# Patient Record
Sex: Male | Born: 1975 | Race: Black or African American | Hispanic: No | Marital: Married | Smoking: Current some day smoker
Health system: Southern US, Community
[De-identification: ages and names within clinical notes are randomized; demographics above are authoritative.]

## PROBLEM LIST (undated history)

## (undated) HISTORY — PX: KNEE SURGERY: SHX244

---

## 1999-08-02 ENCOUNTER — Inpatient Hospital Stay (HOSPITAL_COMMUNITY): Admission: EM | Admit: 1999-08-02 | Discharge: 1999-08-10 | Payer: Self-pay | Admitting: Emergency Medicine

## 1999-08-02 ENCOUNTER — Encounter: Payer: Self-pay | Admitting: Emergency Medicine

## 1999-08-04 ENCOUNTER — Encounter: Payer: Self-pay | Admitting: Pulmonary Disease

## 1999-08-05 ENCOUNTER — Encounter: Payer: Self-pay | Admitting: Pulmonary Disease

## 1999-08-06 ENCOUNTER — Encounter: Payer: Self-pay | Admitting: Pulmonary Disease

## 1999-08-07 ENCOUNTER — Encounter: Payer: Self-pay | Admitting: Pulmonary Disease

## 1999-08-10 ENCOUNTER — Encounter: Payer: Self-pay | Admitting: Pulmonary Disease

## 2003-08-14 ENCOUNTER — Emergency Department (HOSPITAL_COMMUNITY): Admission: EM | Admit: 2003-08-14 | Discharge: 2003-08-14 | Payer: Self-pay | Admitting: Emergency Medicine

## 2005-10-05 ENCOUNTER — Ambulatory Visit: Payer: Self-pay | Admitting: Family Medicine

## 2005-10-07 ENCOUNTER — Ambulatory Visit: Payer: Self-pay | Admitting: Family Medicine

## 2005-10-07 ENCOUNTER — Encounter: Admission: RE | Admit: 2005-10-07 | Discharge: 2005-10-07 | Payer: Self-pay | Admitting: Family Medicine

## 2006-04-12 ENCOUNTER — Ambulatory Visit: Payer: Self-pay | Admitting: Family Medicine

## 2006-04-21 ENCOUNTER — Encounter: Admission: RE | Admit: 2006-04-21 | Discharge: 2006-04-21 | Payer: Self-pay | Admitting: Family Medicine

## 2006-05-12 ENCOUNTER — Ambulatory Visit: Payer: Self-pay | Admitting: Family Medicine

## 2007-02-20 ENCOUNTER — Emergency Department (HOSPITAL_COMMUNITY): Admission: EM | Admit: 2007-02-20 | Discharge: 2007-02-20 | Payer: Self-pay | Admitting: Emergency Medicine

## 2007-02-28 ENCOUNTER — Ambulatory Visit: Payer: Self-pay | Admitting: Family Medicine

## 2007-06-13 ENCOUNTER — Ambulatory Visit: Payer: Self-pay | Admitting: Family Medicine

## 2007-07-25 ENCOUNTER — Ambulatory Visit: Payer: Self-pay | Admitting: Family Medicine

## 2007-09-20 ENCOUNTER — Ambulatory Visit: Payer: Self-pay | Admitting: Family Medicine

## 2007-10-16 ENCOUNTER — Ambulatory Visit: Payer: Self-pay | Admitting: Family Medicine

## 2007-11-30 ENCOUNTER — Encounter: Admission: RE | Admit: 2007-11-30 | Discharge: 2007-11-30 | Payer: Self-pay | Admitting: Family Medicine

## 2007-12-22 ENCOUNTER — Ambulatory Visit: Payer: Self-pay | Admitting: Family Medicine

## 2008-01-03 ENCOUNTER — Emergency Department (HOSPITAL_COMMUNITY): Admission: EM | Admit: 2008-01-03 | Discharge: 2008-01-03 | Payer: Self-pay | Admitting: Emergency Medicine

## 2009-09-20 IMAGING — CT CT HEAD W/O CM
1 series · 16 of 30 positions shown, 20 images · IV contrast (agent unspecified)
Comparison: none

CLINICAL DATA: Severe headache.
 HEAD CT WITHOUT CONTRAST:
TECHNIQUE: Contiguous axial CT images were obtained from the base of the skull through the vertex according to standard protocol without contrast.   
 No comparison.

[Series 2: head routine 4.8 h47s · axial · 0.46mm/px · z∈[-110,+21]mm · 16 of 30 slices shown, 20 images]
[im 2/30  brain]
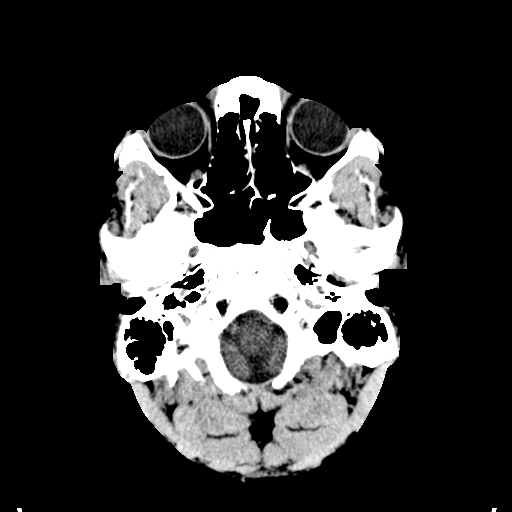
[im 2/30  bone]
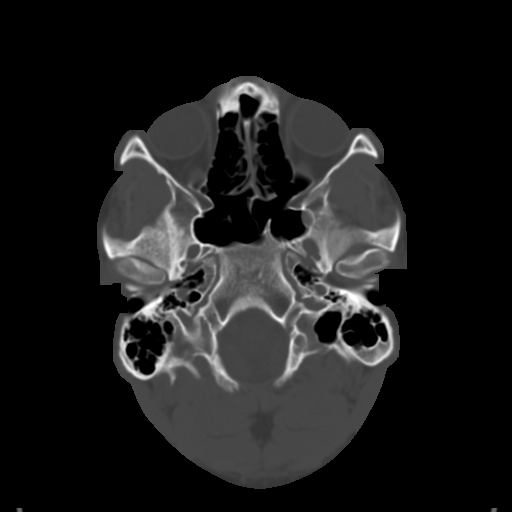
[im 4/30  brain]
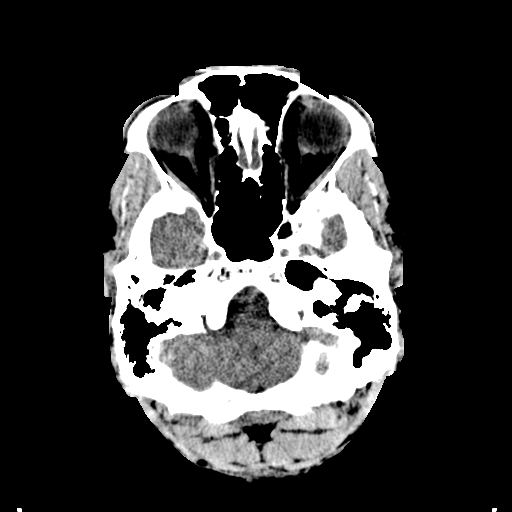
[im 6/30  brain]
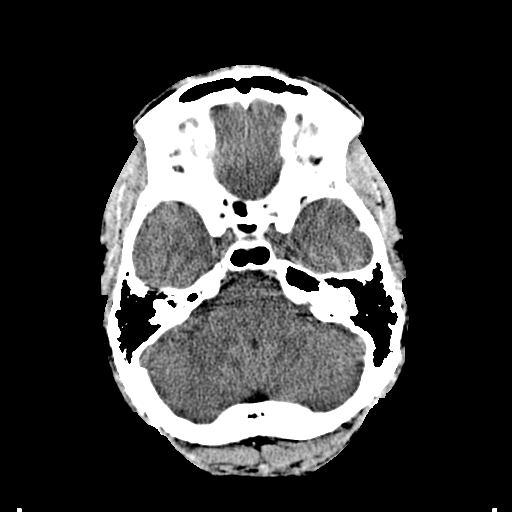
[im 8/30  brain]
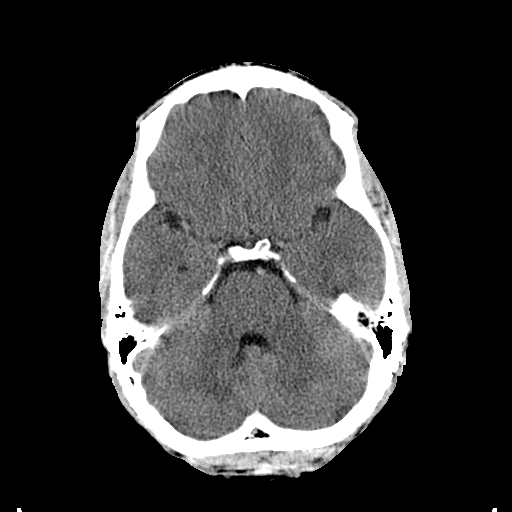
[im 9/30  brain]
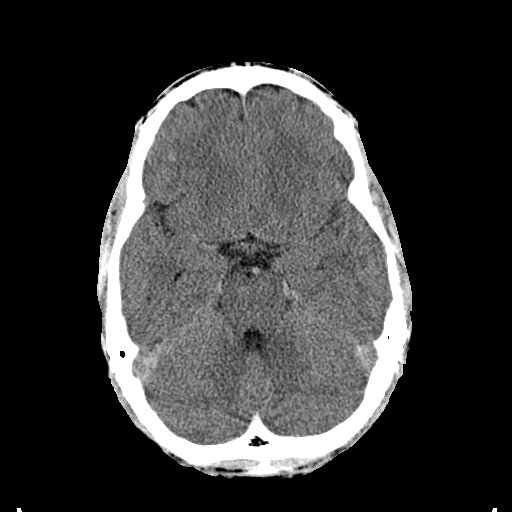
[im 9/30  bone]
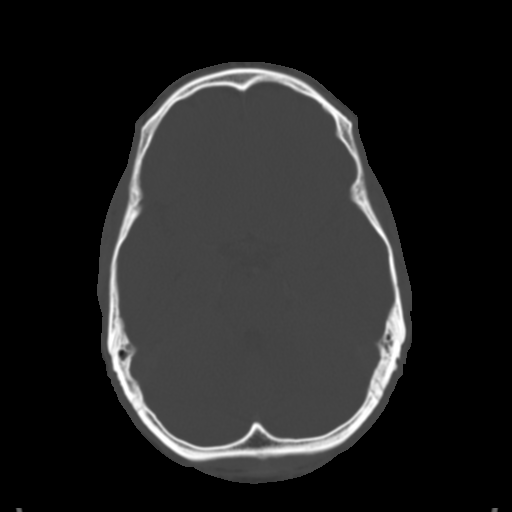
[im 11/30  brain]
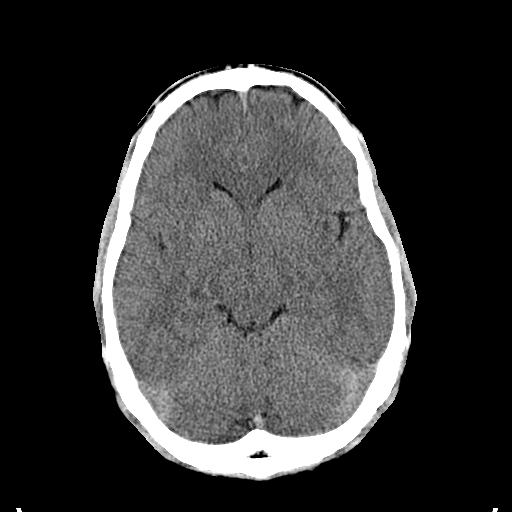
[im 13/30  brain]
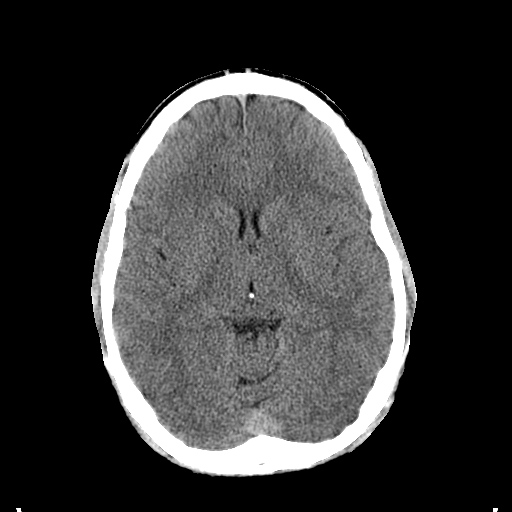
[im 15/30  brain]
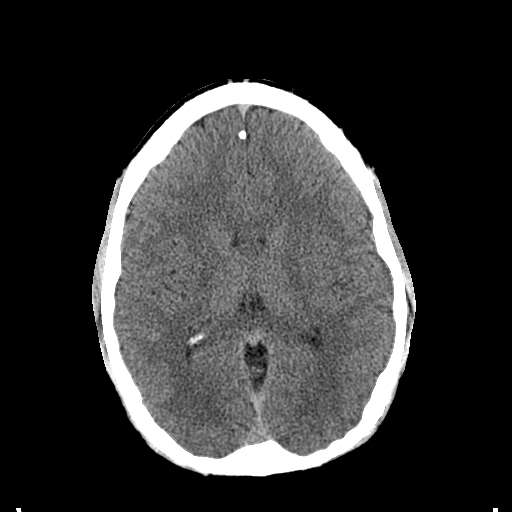
[im 16/30  brain]
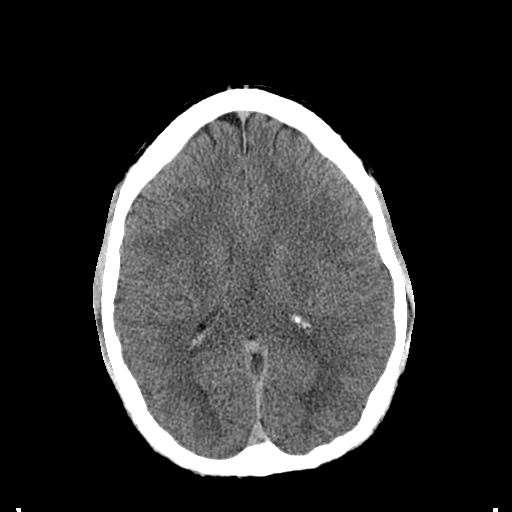
[im 16/30  bone]
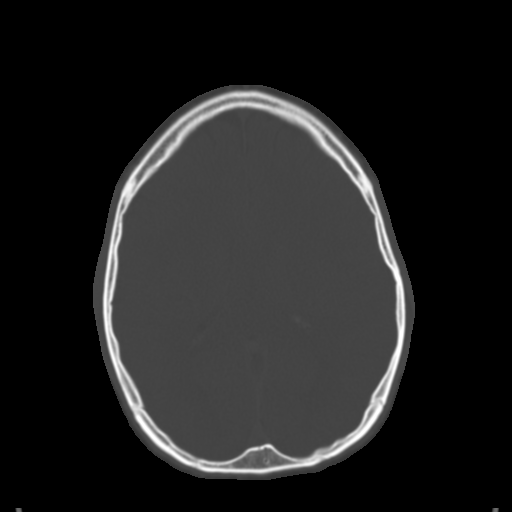
[im 18/30  brain]
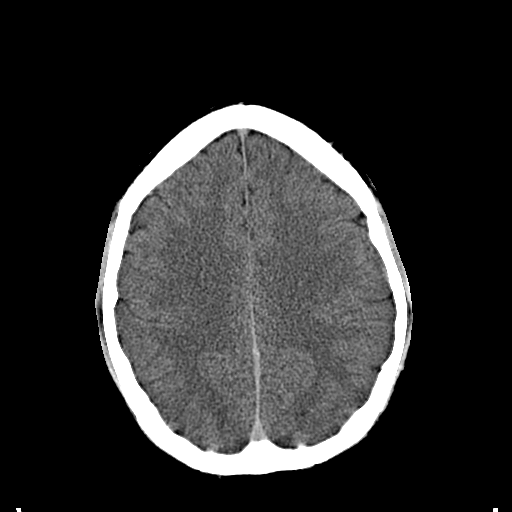
[im 20/30  brain]
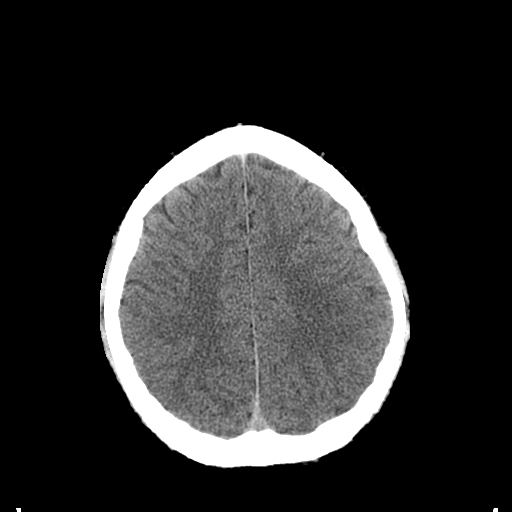
[im 22/30  brain]
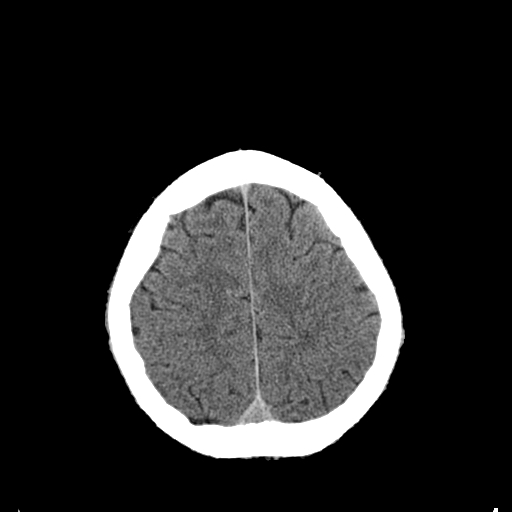
[im 23/30  brain]
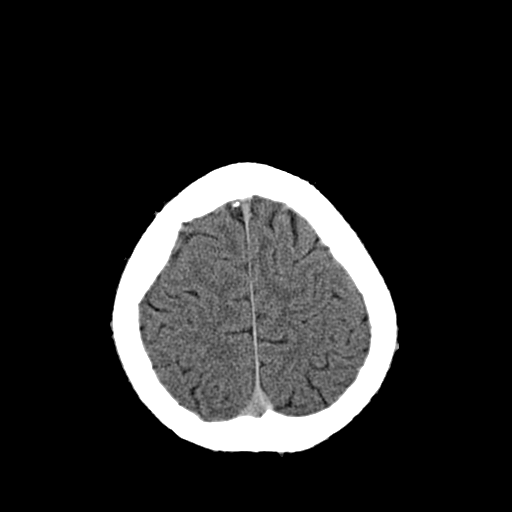
[im 23/30  bone]
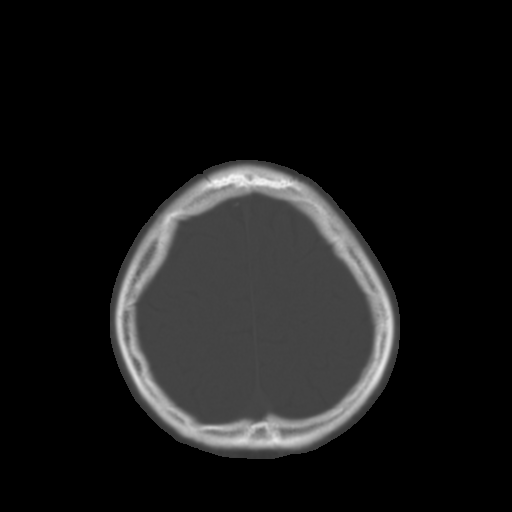
[im 25/30  brain]
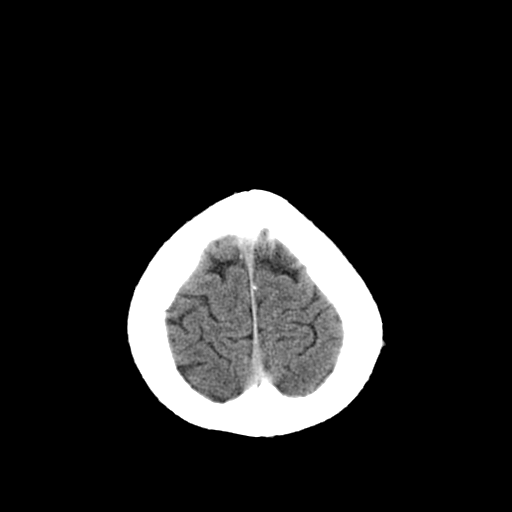
[im 27/30  brain]
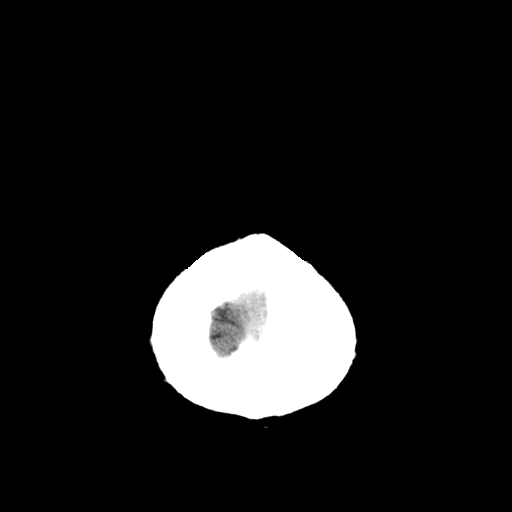
[im 29/30  brain]
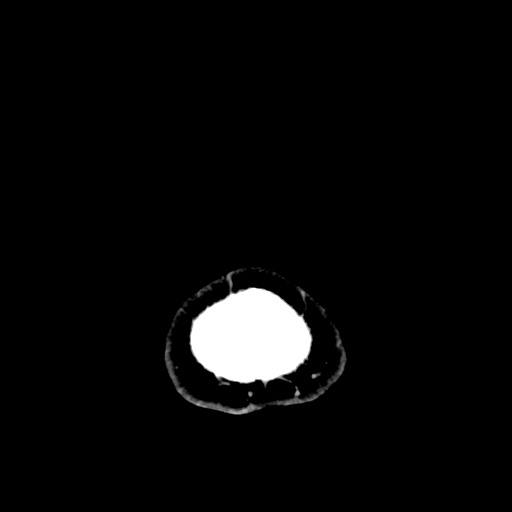

[16 of 30 positions shown; findings below may reference images not displayed]

FINDINGS: The brain has a normal appearance without evidence of atrophy, stroke, mass, hemorrhage, hydrocephalus, or extra-axial collection.  The visualized sinuses are clear.  The middle ears and mastoids are clear.
IMPRESSION: Normal examination.

## 2010-07-31 NOTE — Discharge Summary (Signed)
[facility]  Patient:    Timothy White, Timothy White                         MRN: [id number] Adm. Date:  [id number] Disc. Date: [id number] Attending:  Silvio Pate Dictator:   Earley Favor, RN, MSN, ACNP                           Discharge Summary  DATE OF BIRTH:  January 12, 1975  DISCHARGE DIAGNOSES: 1. Acute respiratory failure, acute respiratory distress syndrome, pneumonia. 2. Septic shock. 3. Acute renal failure. 4. History of asthma.  HISTORY OF PRESENT ILLNESS:  Timothy White is a 35 year old black male, who was admitted to [facility] on Aug 02, 1999, with respiratory distress, hypoxemia, and septic shock.  He had a four-week history of cough that was productive of green mucus, but has been able to get by and work.  On Aug 01, 1999, he had severe fever, chills, sweat, and cough, with hemoptysis and increasing shortness of breath with pleuritic left chest pain.  He presented to the [facility] Emergency Department and had a blood pressure in the 70s, with a temperature of 101 degrees.  He was found to have a dense left lower lobe infiltrate with questionable effusion.  He also has required supplemental oxygen i.e. at 4 L nasal cannula to keep his saturations at greater than 90%.  Despite 4 L of intravenous fluids, he remained hypotensive.  He was admitted to [facility] at that time for further evaluation of acute respiratory failure, septic shock, and acute renal failure.  LABORATORY DATA:  Arterial blood gases on room air on Aug 02, 1999:  pH of 7.45, PCO2 of 35, PO2 of 54.  On 100% nonrebreather:  pH of 7.30, PCO2 of 41, PO2 of 09. On 75% FIO2 BiPAP with a rate of 10, with expiratory 5 and inspiratory of 12: H of 7.38, PCO2 of 36, PO2 of 115.  The wbcs 10.3, rbcs 467, hemoglobin 14.0, hematocrit 42.2, MCV 90.4, MCHC 33.2, RDW 13.2, platelets 344, neutrophils 71, absolute  neutrophils 7.2, lymphs 13, absolute lymphs 1.4, monocytes 5, eosinophils 8, absolute eosinophils 0.8.  ESR 11.  INR 2.6, PTT 47.  Sodium 139, potassium .2, chloride 113, CO2 of 22, BUN 22, creatinine 1.5, glucose 94.  Calcium 7.6, total protein 4.8, ALB 2.5, AST 27, LDH 151.  HIV is nonreactive.  Blood cultures demonstrate no growth after five days.  Sputum culture is negative, shows normal orolaryngeal flora.  a 12-lead electrocardiogram shows sinus tachycardia but otherwise a normal electrocardiogram.  A chest x-ray demonstrates left lower atelectasis and infiltrate, generalized accentuation of hilar and basilar markings, prominent left ventricle.  A CT of he chest demonstrated a left lower lobe infiltrate with incomplete consolidation, small pleural effusion, and probable significant obstruction of the bronchus of the left lower lobe.  There are also scattered inflammatory changes at the right base, the left lingula, and right middle lobe areas.  HOSPITAL COURSE: #1 - ACUTE RESPIRATORY FAILURE WITH ACUTE RESPIRATORY DISTRESS SYNDROME, SECONDARY TO PNEUMONIA:  Timothy White was admitted to [facility] on Aug 02, 1999, in septic shock with radiographic changes consistent with left lower lobe  consolidation with pleural effusion and most likely secondary pneumonia. Sputum cultures did not demonstrate any organ specified.  He was treated empirically with Rocephin and Zithromax.  He responded to antimicrobial therapy, and managed to avoid orotracheal intubation.  Nonmechanical ventilatory support, i.e. BiPAP was utilized for a brief period of time to decrease his work of breathing. Arterial blood gases as mentioned in the laboratory data demonstrate improved aeration by the day of discharge.  He reached maximal hospital benefit by Aug 10, 1999, and was discharged home.  #2 - SEPTIC SHOCK:  The septic shock was treated with IV fluids and  dopamine. e responded to fluid resuscitation and vasopressor drip.  The vasopressor drip and fluid resuscitation were effective, and were discontinued after approximately 24 hours.  #3 - ACUTE RENAL FAILURE:  Timothy White BUN and creatinine reached a peak of 31 nd 3.3 respectively on Aug 02, 1999, responded to fluid resuscitation with a BUN of 12, creatinine 1.1 prior to discharge.  #4 - HISTORY OF ASTHMA:  Timothy White has a known history of asthma.  He also has  been smoking.  He was given special instructions on smoking cessation.  He will be discharged on Adavir 550 one puff b.i.d.  DIET:  Regular diet.  SPECIAL INSTRUCTIONS:  No smoking.  FOLLOWUP:  He has an appointment to follow up with Dr. Onalee Hua B. Simonds in one week.  CONDITION ON DISCHARGE:  Acute respiratory failure has been resolved.  Acute septic shock syndrome has been resolved.  He is discharged home in an improved condition. DD:  08/20/99 TD:  08/26/99 Job: [id number]

## 2010-09-18 ENCOUNTER — Emergency Department (HOSPITAL_COMMUNITY)
Admission: EM | Admit: 2010-09-18 | Discharge: 2010-09-18 | Disposition: A | Discharge status: Home / Self Care | Payer: Worker's Compensation | Attending: Emergency Medicine | Admitting: Emergency Medicine

## 2010-09-18 DIAGNOSIS — Y9269 Other specified industrial and construction area as the place of occurrence of the external cause: Secondary | ICD-10-CM | POA: Insufficient documentation

## 2010-09-18 DIAGNOSIS — X58XXXA Exposure to other specified factors, initial encounter: Secondary | ICD-10-CM | POA: Insufficient documentation

## 2010-09-18 DIAGNOSIS — R0602 Shortness of breath: Secondary | ICD-10-CM | POA: Insufficient documentation

## 2010-09-18 DIAGNOSIS — T7840XA Allergy, unspecified, initial encounter: Secondary | ICD-10-CM | POA: Insufficient documentation

## 2010-09-18 DIAGNOSIS — Y990 Civilian activity done for income or pay: Secondary | ICD-10-CM | POA: Insufficient documentation

## 2011-08-14 ENCOUNTER — Encounter (HOSPITAL_BASED_OUTPATIENT_CLINIC_OR_DEPARTMENT_OTHER): Payer: Self-pay | Admitting: *Deleted

## 2011-08-14 ENCOUNTER — Emergency Department (HOSPITAL_BASED_OUTPATIENT_CLINIC_OR_DEPARTMENT_OTHER)
Admission: EM | Admit: 2011-08-14 | Discharge: 2011-08-14 | Disposition: A | Discharge status: Home / Self Care | Payer: Self-pay | Attending: Emergency Medicine | Admitting: Emergency Medicine

## 2011-08-14 DIAGNOSIS — H6122 Impacted cerumen, left ear: Secondary | ICD-10-CM

## 2011-08-14 DIAGNOSIS — H6120 Impacted cerumen, unspecified ear: Secondary | ICD-10-CM | POA: Insufficient documentation

## 2011-08-14 DIAGNOSIS — F17200 Nicotine dependence, unspecified, uncomplicated: Secondary | ICD-10-CM | POA: Insufficient documentation

## 2011-08-14 DIAGNOSIS — J45909 Unspecified asthma, uncomplicated: Secondary | ICD-10-CM | POA: Insufficient documentation

## 2011-08-14 NOTE — Discharge Instructions (Signed)
Never put any object in your ear that is any smaller than you're elbow. Take Tylenol or ibuprofen or Aleve as needed for pain. You can use over-the-counter ear wax products on an as-needed basis.  Cerumen Impaction A cerumen impaction is when the wax in your ear forms a plug. This plug usually causes reduced hearing. Sometimes it also causes an earache or dizziness. Removing a cerumen impaction can be difficult and painful. The wax sticks to the ear canal. The canal is sensitive and bleeds easily. If you try to remove a heavy wax buildup with a cotton tipped swab, you may push it in further. Irrigation with water, suction, and small ear curettes may be used to clear out the wax. If the impaction is fixed to the skin in the ear canal, ear drops may be needed for a few days to loosen the wax. People who build up a lot of wax frequently can use ear wax removal products available in your local drugstore. SEEK MEDICAL CARE IF:  You develop an earache, increased hearing loss, or marked dizziness. Document Released: 04/08/2004 Document Revised: 02/18/2011 Document Reviewed: 05/29/2009 [facility] Patient Information 2012 [place], [place].

## 2011-08-14 NOTE — ED Provider Notes (Signed)
History   This chart was scribed for Timothy Booze, MD by Brooks Sailors. The patient was seen in room MH11/MH11. Patient's care was started at 1632.  CSN: [id number]  Arrival date & time 08/14/11  1632   First MD Initiated Contact with Patient 08/14/11 1657      Chief Complaint  Patient presents with  . Otalgia  . Knee Pain    (Consider location/radiation/quality/duration/timing/severity/associated sxs/prior treatment) Patient is a 36 y.o. male presenting with ear pain and knee pain. The history is provided by the patient. No language interpreter was used.  Otalgia This is a new problem. The current episode started 6 to 12 hours ago. There is pain in the left ear. The problem occurs constantly. The problem has not changed since onset.There has been no fever. The pain is at a severity of 8/10. The pain is moderate. Associated symptoms include hearing loss.  Knee Pain    Timothy White is a 36 y.o. male who presents to the Emergency Department complaining of ear pain onset this morning 9 hours ago. Pt says his left ear was itching so he tried to scratch it with finger without relief. Then patient used Q-tip to help ear. Now pt reports he can not hear, and was able to hear before this incident. Pt says his pain is currently a 8 at the time. Pt notes he normally is in good health and sometimes smokes a cigar (1/week).   Past Medical History  Diagnosis Date  . Asthma     Past Surgical History  Procedure Date  . Knee surgery     No family history on file.  History  Substance Use Topics  . Smoking status: Current Some Day Smoker    Types: Cigars  . Smokeless tobacco: Never Used  . Alcohol Use: 6.0 oz/week    5 Cans of beer, 5 Shots of liquor per week      Review of Systems  HENT: Positive for hearing loss and ear pain.   All other systems reviewed and are negative.    Allergies  Shellfish allergy  Home Medications   Current Outpatient Rx  Name Route Sig Dispense  Refill  . ALBUTEROL SULFATE HFA 108 (90 BASE) MCG/ACT IN AERS Inhalation Inhale 2 puffs into the lungs every 6 (six) hours as needed. For shortness of breath    . IBUPROFEN 800 MG PO TABS Oral Take 2,400 mg by mouth daily as needed. For pain    . MENTHOL (TOPICAL ANALGESIC) 5 % EX PADS Apply externally Apply 1 patch topically daily as needed. For knee pain      BP 130/74  Pulse 76  Temp(Src) 98.7 F (37.1 C) (Oral)  Resp 18  Ht 6\' 4"  (1.93 m)  Wt 240 lb (108.863 kg)  BMI 29.21 kg/m2  SpO2 100%  Physical Exam  Nursing note and vitals reviewed. Constitutional: He is oriented to person, place, and time. He appears well-developed and well-nourished.  HENT:  Head: Normocephalic and atraumatic.       Right ear moderate cerumen, Left ear has cerumen impaction.   Eyes: Conjunctivae and EOM are normal. Pupils are equal, round, and reactive to light.  Neck: Normal range of motion. Neck supple.  Cardiovascular: Normal rate and regular rhythm.   Pulmonary/Chest: Effort normal and breath sounds normal.  Abdominal: Soft. Bowel sounds are normal.  Musculoskeletal: Normal range of motion.  Neurological: He is alert and oriented to person, place, and time.  Skin: Skin is warm and dry.  Psychiatric: He has a normal mood and affect.    ED Course  Procedures (including critical care time) DIAGNOSTIC STUDIES: Oxygen Saturation is 100% on room air, normal by my interpretation.    COORDINATION OF CARE: 1700 Patient informed of current plan for treatment and evaluation and agrees with plan at this time.      1. Cerumen impaction, left       MDM  Cerumen impaction of the left ear.  Of ear was irrigated by our in and on reexam, there is mild erythema of the tympanic membrane but no perforation. There is successful removal of the cerumen impaction.      I personally performed the services described in this documentation, which was scribed in my presence. The recorded information has  been reviewed and considered.      Timothy Booze, MD 08/14/11 1745

## 2011-08-14 NOTE — ED Notes (Signed)
Pt c/o left pain x 2-3 days- denies injury- also c/o left ear pain x 1 day

## 2012-02-26 ENCOUNTER — Emergency Department (HOSPITAL_COMMUNITY)
Admission: EM | Admit: 2012-02-26 | Discharge: 2012-02-26 | Disposition: A | Discharge status: Home / Self Care | Payer: Self-pay | Attending: Emergency Medicine | Admitting: Emergency Medicine

## 2012-02-26 ENCOUNTER — Encounter (HOSPITAL_COMMUNITY): Payer: Self-pay | Admitting: *Deleted

## 2012-02-26 DIAGNOSIS — R05 Cough: Secondary | ICD-10-CM | POA: Insufficient documentation

## 2012-02-26 DIAGNOSIS — F17200 Nicotine dependence, unspecified, uncomplicated: Secondary | ICD-10-CM | POA: Insufficient documentation

## 2012-02-26 DIAGNOSIS — J45901 Unspecified asthma with (acute) exacerbation: Secondary | ICD-10-CM | POA: Insufficient documentation

## 2012-02-26 DIAGNOSIS — J45909 Unspecified asthma, uncomplicated: Secondary | ICD-10-CM

## 2012-02-26 DIAGNOSIS — R059 Cough, unspecified: Secondary | ICD-10-CM | POA: Insufficient documentation

## 2012-02-26 MED ORDER — AEROCHAMBER Z-STAT PLUS/MEDIUM MISC: 1.0000 | Freq: Once | Status: DC

## 2012-02-26 MED ORDER — ALBUTEROL SULFATE HFA 108 (90 BASE) MCG/ACT IN AERS
2.0000 | INHALATION_SPRAY | RESPIRATORY_TRACT | Status: DC | PRN
  Administered 2012-02-26: 2 via RESPIRATORY_TRACT
  Filled 2012-02-26: qty 6.7

## 2012-02-26 MED ORDER — IPRATROPIUM BROMIDE 0.02 % IN SOLN
0.5000 mg | Freq: Once | RESPIRATORY_TRACT | Status: AC
  Administered 2012-02-26: 0.5 mg via RESPIRATORY_TRACT
  Filled 2012-02-26: qty 2.5

## 2012-02-26 MED ORDER — ALBUTEROL SULFATE (5 MG/ML) 0.5% IN NEBU
5.0000 mg | INHALATION_SOLUTION | Freq: Once | RESPIRATORY_TRACT | Status: AC
  Administered 2012-02-26: 5 mg via RESPIRATORY_TRACT
  Filled 2012-02-26: qty 1

## 2012-02-26 MED ORDER — AEROCHAMBER PLUS W/MASK MISC
Status: AC
  Administered 2012-02-26: 11:00:00
  Filled 2012-02-26: qty 1

## 2012-02-26 NOTE — ED Provider Notes (Signed)
History     CSN: [id number]  Arrival date & time 02/26/12  7829   First MD Initiated Contact with Patient 02/26/12 1008      Chief Complaint  Patient presents with  . Asthma    (Consider location/radiation/quality/duration/timing/severity/associated sxs/prior treatment) HPI Comments: Patient with history of asthma, remote hospitalization, no intubation presents with onset of shortness of breath when he awoke this morning. Patient states last night he was fine. He does not have an inhaler. He states that normally he calms himself down and his symptoms improve. He continues to wheeze and came in for evaluation. He denies fever, upper respiratory tract infection symptoms. He has had some chest tightness. No nausea, vomiting, diarrhea or abdominal pain. Onset acute. Course is persistent. Nothing makes symptoms better or worse.  Patient is a 36 y.o. male presenting with asthma. The history is provided by the patient.  Asthma Associated symptoms include coughing. Pertinent negatives include no abdominal pain, chest pain, fever, headaches, myalgias, nausea, rash, sore throat or vomiting.    Past Medical History  Diagnosis Date  . Asthma     Past Surgical History  Procedure Date  . Knee surgery     History reviewed. No pertinent family history.  History  Substance Use Topics  . Smoking status: Current Some Day Smoker    Types: Cigars  . Smokeless tobacco: Never Used  . Alcohol Use: 6.0 oz/week    5 Cans of beer, 5 Shots of liquor per week      Review of Systems  Constitutional: Negative for fever.  HENT: Negative for sore throat and rhinorrhea.   Eyes: Negative for redness.  Respiratory: Positive for cough, chest tightness, shortness of breath and wheezing.   Cardiovascular: Negative for chest pain and leg swelling.  Gastrointestinal: Negative for nausea, vomiting, abdominal pain and diarrhea.  Genitourinary: Negative for dysuria.  Musculoskeletal: Negative for myalgias.   Skin: Negative for rash.  Neurological: Negative for headaches.    Allergies  Shellfish allergy  Home Medications   Current Outpatient Rx  Name  Route  Sig  Dispense  Refill  . ALBUTEROL SULFATE HFA 108 (90 BASE) MCG/ACT IN AERS   Inhalation   Inhale 2 puffs into the lungs every 6 (six) hours as needed. For shortness of breath           BP 122/80  Pulse 107  Temp 99.2 F (37.3 C) (Oral)  Resp 18  SpO2 97%  Physical Exam  Nursing note and vitals reviewed. Constitutional: He appears well-developed and well-nourished.  HENT:  Head: Normocephalic and atraumatic.  Right Ear: Tympanic membrane, external ear and ear canal normal.  Left Ear: Tympanic membrane, external ear and ear canal normal.  Nose: Nose normal. No mucosal edema or rhinorrhea.  Mouth/Throat: Uvula is midline, oropharynx is clear and moist and mucous membranes are normal.  Eyes: Conjunctivae normal are normal. Right eye exhibits no discharge. Left eye exhibits no discharge.  Neck: Normal range of motion. Neck supple.  Cardiovascular: Regular rhythm and normal heart sounds.  Tachycardia present.   Pulmonary/Chest: Effort normal. No respiratory distress. He has wheezes. He has no rales.       Diffuse, moderate, expiratory wheezing bilaterally. Good air movement.  Abdominal: Soft. There is no tenderness.  Neurological: He is alert.  Skin: Skin is warm and dry.  Psychiatric: He has a normal mood and affect.    ED Course  Procedures (including critical care time)  Labs Reviewed - No data to display No  results found.   1. Asthma     10:23 AM Patient seen and examined. Medications ordered.   Vital signs reviewed and are as follows: Filed Vitals:   02/26/12 0946  BP: 122/80  Pulse: 107  Temp: 99.2 F (37.3 C)  Resp: 18   11:10 AM Wheezing resolved on re-exam, mild tachycardia. Patient states he feels jittery after the albuterol but is breathing much better. Will discharge to home with albuterol  inhaler.  Patient urged to return with worsening trouble breathing, increased work of breathing, fever, or if he has any other concerns. He verbalizes understanding and agrees with this plan.   MDM  Patient with asthma attack. No upper respiratory infectious symptoms. He improved greatly with one treatment in emergency department and is ready for discharge. I do not feel that prednisone is indicated at this point given the short duration of symptoms and improvement. Also no exacerbating factors identified. Patient appears well. No concern for pneumonia or PE.        Renne Crigler, [place] 02/26/12 [phone]

## 2012-02-26 NOTE — ED Notes (Signed)
Pt reports having asthma attack this am, doesn't have any inhalers. Airway intact

## 2012-02-26 NOTE — ED Provider Notes (Signed)
Medical screening examination/treatment/procedure(s) were performed by non-physician practitioner and as supervising physician I was immediately available for consultation/collaboration.  Flint Melter, MD 02/26/12 1710

## 2016-01-26 ENCOUNTER — Emergency Department (HOSPITAL_BASED_OUTPATIENT_CLINIC_OR_DEPARTMENT_OTHER): Payer: BLUE CROSS/BLUE SHIELD

## 2016-01-26 ENCOUNTER — Encounter (HOSPITAL_BASED_OUTPATIENT_CLINIC_OR_DEPARTMENT_OTHER): Payer: Self-pay | Admitting: Emergency Medicine

## 2016-01-26 ENCOUNTER — Emergency Department (HOSPITAL_BASED_OUTPATIENT_CLINIC_OR_DEPARTMENT_OTHER)
Admission: EM | Admit: 2016-01-26 | Discharge: 2016-01-26 | Disposition: A | Discharge status: Home / Self Care | Payer: BLUE CROSS/BLUE SHIELD | Attending: Emergency Medicine | Admitting: Emergency Medicine

## 2016-01-26 DIAGNOSIS — F17290 Nicotine dependence, other tobacco product, uncomplicated: Secondary | ICD-10-CM | POA: Insufficient documentation

## 2016-01-26 DIAGNOSIS — R1010 Upper abdominal pain, unspecified: Secondary | ICD-10-CM | POA: Diagnosis present

## 2016-01-26 DIAGNOSIS — R112 Nausea with vomiting, unspecified: Secondary | ICD-10-CM | POA: Diagnosis not present

## 2016-01-26 DIAGNOSIS — J45909 Unspecified asthma, uncomplicated: Secondary | ICD-10-CM | POA: Diagnosis not present

## 2016-01-26 DIAGNOSIS — R197 Diarrhea, unspecified: Secondary | ICD-10-CM | POA: Diagnosis not present

## 2016-01-26 LAB — CBC WITH DIFFERENTIAL/PLATELET
Basophils Absolute: 0 10*3/uL (ref 0.0–0.1)
Basophils Relative: 0 %
Eosinophils Absolute: 0.2 10*3/uL (ref 0.0–0.7)
Eosinophils Relative: 4 %
HEMATOCRIT: 45.4 % (ref 39.0–52.0)
HEMOGLOBIN: 15.4 g/dL (ref 13.0–17.0)
LYMPHS ABS: 2.2 10*3/uL (ref 0.7–4.0)
LYMPHS PCT: 35 %
MCH: 30.4 pg (ref 26.0–34.0)
MCHC: 33.9 g/dL (ref 30.0–36.0)
MCV: 89.5 fL (ref 78.0–100.0)
MONOS PCT: 8 %
Monocytes Absolute: 0.5 10*3/uL (ref 0.1–1.0)
NEUTROS ABS: 3.3 10*3/uL (ref 1.7–7.7)
NEUTROS PCT: 53 %
Platelets: 247 10*3/uL (ref 150–400)
RBC: 5.07 MIL/uL (ref 4.22–5.81)
RDW: 13.3 % (ref 11.5–15.5)
WBC: 6.3 10*3/uL (ref 4.0–10.5)

## 2016-01-26 LAB — COMPREHENSIVE METABOLIC PANEL
ALT: 31 U/L (ref 17–63)
ANION GAP: 6 (ref 5–15)
AST: 28 U/L (ref 15–41)
Albumin: 4 g/dL (ref 3.5–5.0)
Alkaline Phosphatase: 58 U/L (ref 38–126)
BUN: 12 mg/dL (ref 6–20)
CHLORIDE: 104 mmol/L (ref 101–111)
CO2: 27 mmol/L (ref 22–32)
CREATININE: 1.22 mg/dL (ref 0.61–1.24)
Calcium: 9 mg/dL (ref 8.9–10.3)
GFR calc non Af Amer: >60 mL/min (ref >60)
Glucose, Bld: 97 mg/dL (ref 65–99)
POTASSIUM: 4.1 mmol/L (ref 3.5–5.1)
SODIUM: 137 mmol/L (ref 135–145)
Total Bilirubin: 0.9 mg/dL (ref 0.3–1.2)
Total Protein: 7.1 g/dL (ref 6.5–8.1)

## 2016-01-26 LAB — LIPASE, BLOOD: Lipase: 18 U/L (ref 11–51)

## 2016-01-26 MED ORDER — DICYCLOMINE HCL 20 MG PO TABS: 20.0000 mg | ORAL_TABLET | Freq: Two times a day (BID) | ORAL | 0 refills | Status: AC

## 2016-01-26 MED ORDER — SODIUM CHLORIDE 0.9 % IV BOLUS (SEPSIS)
1000.0000 mL | Freq: Once | INTRAVENOUS | Status: AC
  Administered 2016-01-26: 1000 mL via INTRAVENOUS

## 2016-01-26 MED ORDER — GI COCKTAIL ~~LOC~~
30.0000 mL | Freq: Once | ORAL | Status: AC
  Administered 2016-01-26: 30 mL via ORAL
  Filled 2016-01-26: qty 30

## 2016-01-26 MED ORDER — DICYCLOMINE HCL 20 MG PO TABS
20.0000 mg | ORAL_TABLET | Freq: Once | ORAL | Status: AC
  Administered 2016-01-26: 20 mg via ORAL
  Filled 2016-01-26: qty 1

## 2016-01-26 MED ORDER — MORPHINE SULFATE (PF) 4 MG/ML IV SOLN
4.0000 mg | Freq: Once | INTRAVENOUS | Status: AC
  Administered 2016-01-26: 4 mg via INTRAVENOUS
  Filled 2016-01-26: qty 1

## 2016-01-26 MED ORDER — DICYCLOMINE HCL 10 MG PO CAPS
ORAL_CAPSULE | ORAL | Status: AC
  Administered 2016-01-26: 20 mg
  Filled 2016-01-26: qty 2

## 2016-01-26 MED FILL — DICYCLOMINE 20 MG TABLET: 20 | 10 days supply | Qty: 20 | Fill #0

## 2016-01-26 NOTE — ED Provider Notes (Signed)
[facility] Provider Note   CSN: [id number] Arrival date & time: 01/26/16  1150     History   Chief Complaint Chief Complaint  Patient presents with  . Abdominal Pain    HPI Timothy White is a 40 y.o. male.  The history is provided by the patient and medical records. No language interpreter was used.  Abdominal Pain   Associated symptoms include diarrhea, nausea and vomiting. Pertinent negatives include fever, dysuria and headaches.   Timothy White is a 40 y.o. male  with a PMH of asthma who presents to the Emergency Department complaining of upper abdominal pain described as squeezing, cramping x 2-3 days. Patient endorses associated nausea, two episodes of emesis and several non-bloody loose stools at onset, but no n/v/d today. Imodium and alka seltzer taken with little relief. No fevers, dysuria, back pain, chest pain, shortness of breath. Has been able to drink water and tolerate PO today and feels as if he is well hydrated. No sick contacts, recent travel, new foods. He believes that he ate something that upset his stomach a few days ago but figured his abdominal pain would have improved by now.   Past Medical History:  Diagnosis Date  . Asthma     There are no active problems to display for this patient.   Past Surgical History:  Procedure Laterality Date  . KNEE SURGERY         Home Medications    Prior to Admission medications   Medication Sig Start Date End Date Taking? Authorizing Provider  albuterol (PROVENTIL HFA;VENTOLIN HFA) 108 (90 BASE) MCG/ACT inhaler Inhale 2 puffs into the lungs every 6 (six) hours as needed. For shortness of breath    Historical Provider, MD  dicyclomine (BENTYL) 20 MG tablet Take 1 tablet (20 mg total) by mouth 2 (two) times daily. 01/26/16   Chase PicketJaime Pilcher Kaulana Brindle, PA-C    Family History No family history on file.  Social History Social History  Substance Use Topics  . Smoking status: Current Some Day Smoker   Types: Cigars  . Smokeless tobacco: Never Used  . Alcohol use 6.0 oz/week    5 Cans of beer, 5 Shots of liquor per week     Allergies   Shellfish allergy   Review of Systems Review of Systems  Constitutional: Negative for fever.  HENT: Negative for congestion.   Eyes: Negative for visual disturbance.  Respiratory: Negative for cough and shortness of breath.   Cardiovascular: Negative for chest pain.  Gastrointestinal: Positive for abdominal pain, diarrhea, nausea and vomiting. Negative for blood in stool.  Genitourinary: Negative for dysuria.  Musculoskeletal: Negative for back pain.  Skin: Negative for rash.  Neurological: Negative for headaches.     Physical Exam Updated Vital Signs BP 104/79 (BP Location: Left Arm)   Pulse 63   Temp 98.5 F (36.9 C) (Oral)   Resp 20   Ht 6\' 5"  (1.956 m)   Wt 108.9 kg   SpO2 100%   BMI 28.46 kg/m   Physical Exam  Constitutional: He is oriented to person, place, and time. He appears well-developed and well-nourished. No distress.  HENT:  Head: Normocephalic and atraumatic.  Cardiovascular: Normal rate, regular rhythm and normal heart sounds.   No murmur heard. Pulmonary/Chest: Effort normal and breath sounds normal. No respiratory distress.  Abdominal: Soft. Bowel sounds are normal. He exhibits no distension. There is no rebound and no guarding.  Tenderness to palpation of upper abdomen, most significantly of epigastrium. Negative  murphy's.   Musculoskeletal: He exhibits no edema.  Neurological: He is alert and oriented to person, place, and time.  Skin: Skin is warm and dry.  Nursing note and vitals reviewed.    ED Treatments / Results  Labs (all labs ordered are listed, but only abnormal results are displayed) Labs Reviewed  COMPREHENSIVE METABOLIC PANEL  CBC WITH DIFFERENTIAL/PLATELET  LIPASE, BLOOD    EKG  EKG Interpretation None       Radiology Kore[place] Abdomen Limited Ruq  Result Date: 01/26/2016 CLINICAL  DATA:  Upper abdominal pain, epigastric and RIGHT upper quadrant pain with nausea and vomiting for 3-4 days EXAM: US ABDOMEN LIMITED - RIGHT UPPER QUADRANT COMPARISON:  None FINDINGS: Gallbladder: Normally distended without stones or pericholecystic fluid. Upper normal gallbladder wall thickness. Unable to assess for presence of a sonographic Murphy sign due to patient having received morphine prior to exam. Common bile duct: Diameter: 4 mm diameter, normal Liver: Normal appearance No RIGHT upper quadrant free fluid IMPRESSION: Normal exam. Electronically Signed   By: Ulyses SouthwardMark  Boles M.D.   On: 01/26/2016 13:31    Procedures Procedures (including critical care time)  Medications Ordered in ED Medications  morphine 4 MG/ML injection 4 mg (4 mg Intravenous Given 01/26/16 1236)  gi cocktail (Maalox,Lidocaine,Donnatal) (30 mLs Oral Given 01/26/16 1235)  sodium chloride 0.9 % bolus 1,000 mL (0 mLs Intravenous Stopped 01/26/16 1503)  dicyclomine (BENTYL) tablet 20 mg (20 mg Oral Given 01/26/16 1454)  dicyclomine (BENTYL) 10 MG capsule (20 mg  Given 01/26/16 1502)     Initial Impression / Assessment and Plan / ED Course  I have reviewed the triage vital signs and the nursing notes.  Pertinent labs & imaging results that were available during my care of the patient were reviewed by me and considered in my medical decision making (see chart for details).  Clinical Course    Timothy White is a 40 y.o. male who presents to ED for upper abdominal pain x 2-3 days. Initially had n/v/d which has now resolved - none today. On exam, patient with tenderness to epigastrium - no peritoneal signs. Patient is nontoxic, nonseptic appearing, in no apparent distress. Patient's pain and other symptoms adequately managed in emergency department. Fluid bolus given. Labs reviewed and wdl. RUQ ultrasound negative. Patient does not meet the SIRS or Sepsis criteria. On repeat exam patient does not have a surgical abdomen. No  indication of appendicitis, bowel obstruction, bowel perforation, cholecystitis, diverticulitis. Patient discharged home with symptomatic treatment and given strict instructions for follow-up with their primary care physician. I have also discussed reasons to return immediately to the ER. Patient expresses understanding and agrees with plan.  Final Clinical Impressions(s) / ED Diagnoses   Final diagnoses:  Upper abdominal pain  Nausea vomiting and diarrhea    New Prescriptions Discharge Medication List as of 01/26/2016  2:38 PM    START taking these medications   Details  dicyclomine (BENTYL) 20 MG tablet Take 1 tablet (20 mg total) by mouth 2 (two) times daily., Starting Mon 01/26/2016, Print         CIT GroupJaime Pilcher Dakisha Schoof, PA-C 01/26/16 1604    Shaune Pollackameron Isaacs, MD 01/26/16 2021

## 2016-01-26 NOTE — Discharge Instructions (Signed)
Follow up with your primary care doctor about your hospital visit if symptoms are not improved by the end of the week. Continue to hydrate orally with small sips of fluids throughout the day. Bentyl as needed for abdominal cramping.   The 'BRAT' diet is suggested, then progress to diet as tolerated as symptoms abate.  Bananas.  Rice.  Applesauce.  Toast (and other simple starches such as crackers, potatoes, noodles).   SEEK IMMEDIATE MEDICAL ATTENTION IF: You begin having localized abdominal pain that does not go away or becomes severe A temperature above 101 develops Repeated vomiting occurs (multiple uncontrollable episodes) or you are unable to keep fluids down Blood is being passed in stools or vomit (bright red or black tarry stools).

## 2016-01-26 NOTE — ED Notes (Signed)
Patient is alert and oriented x3.  He was given DC instructions and follow up visit instructions.  Patient gave verbal understanding.  He was DC ambulatory under his own power to home.  V/S stable.  He was not showing any signs of distress on DC 

## 2016-01-26 NOTE — ED Triage Notes (Signed)
Patient reports Friday he ate something that he may have food poisoning. The patient reports that since he has On and off N/V/D and epigastric pain

## 2016-11-08 ENCOUNTER — Emergency Department (HOSPITAL_BASED_OUTPATIENT_CLINIC_OR_DEPARTMENT_OTHER)
Admission: EM | Admit: 2016-11-08 | Discharge: 2016-11-08 | Disposition: A | Discharge status: Home / Self Care | Payer: BLUE CROSS/BLUE SHIELD | Attending: Emergency Medicine | Admitting: Emergency Medicine

## 2016-11-08 ENCOUNTER — Encounter (HOSPITAL_BASED_OUTPATIENT_CLINIC_OR_DEPARTMENT_OTHER): Payer: Self-pay | Admitting: *Deleted

## 2016-11-08 DIAGNOSIS — S81811A Laceration without foreign body, right lower leg, initial encounter: Secondary | ICD-10-CM | POA: Insufficient documentation

## 2016-11-08 DIAGNOSIS — Y999 Unspecified external cause status: Secondary | ICD-10-CM | POA: Diagnosis not present

## 2016-11-08 DIAGNOSIS — Y929 Unspecified place or not applicable: Secondary | ICD-10-CM | POA: Diagnosis not present

## 2016-11-08 DIAGNOSIS — F17290 Nicotine dependence, other tobacco product, uncomplicated: Secondary | ICD-10-CM | POA: Insufficient documentation

## 2016-11-08 DIAGNOSIS — Y9301 Activity, walking, marching and hiking: Secondary | ICD-10-CM | POA: Diagnosis not present

## 2016-11-08 DIAGNOSIS — J45909 Unspecified asthma, uncomplicated: Secondary | ICD-10-CM | POA: Insufficient documentation

## 2016-11-08 DIAGNOSIS — Z23 Encounter for immunization: Secondary | ICD-10-CM | POA: Diagnosis not present

## 2016-11-08 DIAGNOSIS — S8991XA Unspecified injury of right lower leg, initial encounter: Secondary | ICD-10-CM | POA: Diagnosis present

## 2016-11-08 DIAGNOSIS — Z79899 Other long term (current) drug therapy: Secondary | ICD-10-CM | POA: Insufficient documentation

## 2016-11-08 DIAGNOSIS — W0110XA Fall on same level from slipping, tripping and stumbling with subsequent striking against unspecified object, initial encounter: Secondary | ICD-10-CM | POA: Diagnosis not present

## 2016-11-08 MED ORDER — SULFAMETHOXAZOLE-TRIMETHOPRIM 800-160 MG PO TABS: 1.0000 | ORAL_TABLET | Freq: Two times a day (BID) | ORAL | 0 refills | Status: AC

## 2016-11-08 MED ORDER — CEPHALEXIN 500 MG PO CAPS: 500.0000 mg | ORAL_CAPSULE | Freq: Four times a day (QID) | ORAL | 0 refills | Status: AC

## 2016-11-08 MED ORDER — TETANUS-DIPHTH-ACELL PERTUSSIS 5-2.5-18.5 LF-MCG/0.5 IM SUSP
0.5000 mL | Freq: Once | INTRAMUSCULAR | Status: AC
  Administered 2016-11-08: 0.5 mL via INTRAMUSCULAR
  Filled 2016-11-08: qty 0.5

## 2016-11-08 MED FILL — SULFAMETHOXAZOLE/TMP DS TAB: 800-160 | 7 days supply | Qty: 14 | Fill #0

## 2016-11-08 MED FILL — CEPHALEXIN 500 MG CAPSULE: 500 | 10 days supply | Qty: 40 | Fill #0

## 2016-11-08 NOTE — ED Provider Notes (Signed)
[facility] Provider Note   CSN: [id number] Arrival date & time: 11/08/16  1423     History   Chief Complaint Chief Complaint  Patient presents with  . Fall    HPI Timothy White is a 41 y.o. male.  41 yo M with a chief complaint of a fall. [place] This happened about 4 days ago. He has been applying shea butter and peroxide to it.  Has transitioned to Neosporin. He is unsure of his last tetanus. Denies fevers or chills. Denies any redness. Has been a disorder without difficulty. He denies being able to see the bone after the injury. He denied that this was a sewer drain.   The history is provided by the patient.  Fall  This is a new problem. The current episode started more than 2 days ago. The problem occurs constantly. The problem has not changed since onset.Pertinent negatives include no chest pain, no abdominal pain, no headaches and no shortness of breath. Nothing aggravates the symptoms. Nothing relieves the symptoms. He has tried nothing for the symptoms. The treatment provided no relief.    Past Medical History:  Diagnosis Date  . Asthma     There are no active problems to display for this patient.   Past Surgical History:  Procedure Laterality Date  . KNEE SURGERY         Home Medications    Prior to Admission medications   Medication Sig Start Date End Date Taking? Authorizing Provider  albuterol (PROVENTIL HFA;VENTOLIN HFA) 108 (90 BASE) MCG/ACT inhaler Inhale 2 puffs into the lungs every 6 (six) hours as needed. For shortness of breath   Yes [provider]  cephALEXin (KEFLEX) 500 MG capsule Take 1 capsule (500 mg total) by mouth 4 (four) times daily. 11/08/16   Melene Plan, DO  dicyclomine (BENTYL) 20 MG tablet Take 1 tablet (20 mg total) by mouth 2 (two) times daily. 01/26/16   Ward, Chase Picket, PA-C  sulfamethoxazole-trimethoprim (BACTRIM DS,SEPTRA DS) 800-160 MG tablet Take 1  tablet by mouth 2 (two) times daily. 11/08/16 11/15/16  Melene Plan, DO    Family History No family history on file.  Social History Social History  Substance Use Topics  . Smoking status: Current Some Day Smoker    Types: Cigars  . Smokeless tobacco: Never Used  . Alcohol use 6.0 oz/week    5 Cans of beer, 5 Shots of liquor per week     Allergies   Shellfish allergy   Review of Systems Review of Systems  Constitutional: Negative for chills and fever.  HENT: Negative for congestion and facial swelling.   Eyes: Negative for discharge and visual disturbance.  Respiratory: Negative for shortness of breath.   Cardiovascular: Negative for chest pain and palpitations.  Gastrointestinal: Negative for abdominal pain, diarrhea and vomiting.  Musculoskeletal: Negative for arthralgias and myalgias.  Skin: Positive for wound. Negative for color change and rash.  Neurological: Negative for tremors, syncope and headaches.  Psychiatric/Behavioral: Negative for confusion and dysphoric mood.     Physical Exam Updated Vital Signs BP 121/84   Pulse 91   Temp (!) 97.4 F (36.3 C) (Oral)   Resp 20   Ht 6\' 5"  (1.956 m)   Wt 104.3 kg (230 lb)   SpO2 97%   BMI 27.27 kg/m   Physical Exam  Constitutional: He is oriented to person, place, and time. He appears well-developed and well-nourished.  HENT:  Head: Normocephalic and atraumatic.  Eyes: Pupils are equal, round, and reactive to light. EOM are normal.  Neck: Normal range of motion. Neck supple. No JVD present.  Cardiovascular: Normal rate and regular rhythm.  Exam reveals no gallop and no friction rub.   No murmur heard. Pulmonary/Chest: No respiratory distress. He has no wheezes.  Abdominal: He exhibits no distension and no mass. There is no tenderness. There is no rebound and no guarding.  Musculoskeletal: Normal range of motion. He exhibits tenderness.  Laceration to the right anterior shin. About 5 cm in length. There appears to  be some mild purulent drainage. There is no fluctuance no surrounding erythema. Pulse motor and sensation is intact distally.  Neurological: He is alert and oriented to person, place, and time.  Skin: No rash noted. No pallor.  Psychiatric: He has a normal mood and affect. His behavior is normal.  Nursing note and vitals reviewed.    ED Treatments / Results  Labs (all labs ordered are listed, but only abnormal results are displayed) Labs Reviewed - No data to display  EKG  EKG Interpretation None       Radiology No results found.  Procedures Procedures (including critical care time)  Medications Ordered in ED Medications  Tdap (BOOSTRIX) injection 0.5 mL (not administered)     Initial Impression / Assessment and Plan / ED Course  I have reviewed the triage vital signs and the nursing notes.  Pertinent labs & imaging results that were available during my care of the patient were reviewed by me and considered in my medical decision making (see chart for details).     41 yo M With a chief complaints of a laceration to his shin. This occurred 4 days ago. It sounds like this was a dirty wound. This may be infected though there is no spreading redness. No fluctuance. Will start on antibiotics. PCP follow-up.  3:30 PM:  I have discussed the diagnosis/risks/treatment options with the patient and family and believe the pt to be eligible for discharge home to follow-up with PCP. We also discussed returning to the ED immediately if new or worsening sx occur. We discussed the sx which are most concerning (e.g., sudden worsening pain, fever, inability to tolerate by mouth) that necessitate immediate return. Medications administered to the patient during their visit and any new prescriptions provided to the patient are listed below.  Medications given during this visit Medications  Tdap (BOOSTRIX) injection 0.5 mL (not administered)     The patient appears reasonably screen and/or  stabilized for discharge and I doubt any other medical condition or other [facility] requiring further screening, evaluation, or treatment in the ED at this time prior to discharge.    Final Clinical Impressions(s) / ED Diagnoses   Final diagnoses:  Laceration of right lower extremity, initial encounter    New Prescriptions New Prescriptions   CEPHALEXIN (KEFLEX) 500 MG CAPSULE    Take 1 capsule (500 mg total) by mouth 4 (four) times daily.   SULFAMETHOXAZOLE-TRIMETHOPRIM (BACTRIM DS,SEPTRA DS) 800-160 MG TABLET    Take 1 tablet by mouth 2 (two) times daily.     Melene Plan, DO 11/08/16 1530

## 2016-11-08 NOTE — ED Notes (Signed)
Patient stated that he fell in a water drain with one of the grid missing.  He did not see the hole because it was dark.  This incident happened in [place].  He did apply Peroxide and Shea butter to the affected area while he was at [place].

## 2016-11-08 NOTE — ED Triage Notes (Signed)
He fell into a hole 2 days ago. Abrasion to his right lower leg that may be infected.

## 2016-11-08 NOTE — Discharge Instructions (Signed)
Return for fever, rapid spreading redness.   Take 4 over the counter ibuprofen tablets 3 times a day or 2 over-the-counter naproxen tablets twice a day for pain. Also take tylenol 1000mg (2 extra strength) four times a day.

## 2017-09-12 ENCOUNTER — Encounter: Payer: Self-pay | Admitting: Pediatrics

## 2019-05-31 ENCOUNTER — Ambulatory Visit: Payer: BC Managed Care – PPO | Attending: Family

## 2019-05-31 DIAGNOSIS — Z23 Encounter for immunization: Secondary | ICD-10-CM

## 2019-05-31 NOTE — Progress Notes (Signed)
   Covid-19 Vaccination Clinic  Name:  Timothy White    MRN: [id number] DOB: 08-10-1975  05/31/2019  Mr. Timothy White was observed post Covid-19 immunization for 15 minutes without incident. He was provided with Vaccine Information Sheet and instruction to access the V-Safe system.   Mr. Timothy White was instructed to call 911 with any severe reactions post vaccine: Marland Kitchen Difficulty breathing  . Swelling of face and throat  . A fast heartbeat  . A bad rash all over body  . Dizziness and weakness   Immunizations Administered    Name Date Dose VIS Date Route   Moderna COVID-19 Vaccine 05/31/2019  4:01 PM 0.5 mL 02/13/2019 Intramuscular   Manufacturer: Moderna   Lot: [id number]   NDC: 42876-811-57

## 2019-07-03 ENCOUNTER — Ambulatory Visit: Payer: BC Managed Care – PPO | Attending: Family

## 2019-07-03 DIAGNOSIS — Z23 Encounter for immunization: Secondary | ICD-10-CM

## 2019-07-03 NOTE — Progress Notes (Signed)
   Covid-19 Vaccination Clinic  Name:  Claudy Abdallah    MRN: [id number] DOB: 03/26/1975  07/03/2019  Mr. Pursell was observed post Covid-19 immunization for 15 minutes without incident. He was provided with Vaccine Information Sheet and instruction to access the V-Safe system.   Mr. Wesch was instructed to call 911 with any severe reactions post vaccine: Marland Kitchen Difficulty breathing  . Swelling of face and throat  . A fast heartbeat  . A bad rash all over body  . Dizziness and weakness   Immunizations Administered    Name Date Dose VIS Date Route   Moderna COVID-19 Vaccine 07/03/2019  3:55 PM 0.5 mL 02/2019 Intramuscular   Manufacturer: Moderna   Lot: [id number]   NDC: 57493-552-17
